# Patient Record
Sex: Male | Born: 1995 | Race: Black or African American | Hispanic: No | Marital: Single | Smoking: Never smoker
Health system: Southern US, Community
[De-identification: ages and names within clinical notes are randomized; demographics above are authoritative.]

---
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
## 2013-07-19 ENCOUNTER — Emergency Department (HOSPITAL_COMMUNITY)
Admission: EM | Admit: 2013-07-19 | Discharge: 2013-07-19 | Disposition: A | Discharge status: Home / Self Care | Payer: Medicaid Other | Attending: Emergency Medicine | Admitting: Emergency Medicine

## 2013-07-19 ENCOUNTER — Encounter (HOSPITAL_COMMUNITY): Payer: Self-pay | Admitting: Emergency Medicine

## 2013-07-19 DIAGNOSIS — R197 Diarrhea, unspecified: Secondary | ICD-10-CM | POA: Insufficient documentation

## 2013-07-19 DIAGNOSIS — R112 Nausea with vomiting, unspecified: Secondary | ICD-10-CM

## 2013-07-19 DIAGNOSIS — Z881 Allergy status to other antibiotic agents status: Secondary | ICD-10-CM | POA: Insufficient documentation

## 2013-07-19 DIAGNOSIS — F1210 Cannabis abuse, uncomplicated: Secondary | ICD-10-CM

## 2013-07-19 LAB — CBC WITH DIFFERENTIAL/PLATELET
Eosinophils Absolute: 0.3 10*3/uL (ref 0.0–0.7)
HCT: 49.7 % (ref 39.0–52.0)
Hemoglobin: 17.4 g/dL — ABNORMAL HIGH (ref 13.0–17.0)
MCH: 29.3 pg (ref 26.0–34.0)
MCV: 83.7 fL (ref 78.0–100.0)
Neutro Abs: 9 10*3/uL — ABNORMAL HIGH (ref 1.7–7.7)
RDW: 14 % (ref 11.5–15.5)

## 2013-07-19 LAB — COMPREHENSIVE METABOLIC PANEL
AST: 21 U/L (ref 0–37)
Alkaline Phosphatase: 61 U/L (ref 39–117)
BUN: 19 mg/dL (ref 6–23)
CO2: 27 mEq/L (ref 19–32)
Chloride: 99 mEq/L (ref 96–112)
Creatinine, Ser: 0.99 mg/dL (ref 0.50–1.35)
GFR calc Af Amer: >90 mL/min (ref >90)
Potassium: 3.9 mEq/L (ref 3.5–5.1)
Total Bilirubin: 2.3 mg/dL — ABNORMAL HIGH (ref 0.3–1.2)
Total Protein: 8.2 g/dL (ref 6.0–8.3)

## 2013-07-19 LAB — URINALYSIS, ROUTINE W REFLEX MICROSCOPIC
Glucose, UA: NEGATIVE mg/dL
Leukocytes, UA: NEGATIVE
Nitrite: NEGATIVE
Protein, ur: NEGATIVE mg/dL

## 2013-07-19 LAB — LIPASE, BLOOD: Lipase: 24 U/L (ref 11–59)

## 2013-07-19 MED ORDER — DIPHENHYDRAMINE HCL 50 MG/ML IJ SOLN
25.0000 mg | Freq: Once | INTRAMUSCULAR | Status: AC
  Administered 2013-07-19: 25 mg via INTRAMUSCULAR
  Filled 2013-07-19: qty 1

## 2013-07-19 MED ORDER — ONDANSETRON 4 MG PO TBDP
4.0000 mg | ORAL_TABLET | Freq: Once | ORAL | Status: AC
  Administered 2013-07-19: 4 mg via ORAL
  Filled 2013-07-19: qty 1

## 2013-07-19 MED ORDER — DICYCLOMINE HCL 20 MG PO TABS: 20.0000 mg | ORAL_TABLET | Freq: Two times a day (BID) | ORAL | Status: DC

## 2013-07-19 MED ORDER — IOHEXOL 300 MG/ML  SOLN: 25.0000 mL | INTRAMUSCULAR | Status: DC

## 2013-07-19 MED ORDER — KETOROLAC TROMETHAMINE 60 MG/2ML IM SOLN
60.0000 mg | Freq: Once | INTRAMUSCULAR | Status: AC
  Administered 2013-07-19: 60 mg via INTRAMUSCULAR
  Filled 2013-07-19: qty 2

## 2013-07-19 MED ORDER — ONDANSETRON HCL 4 MG PO TABS: 4.0000 mg | ORAL_TABLET | Freq: Four times a day (QID) | ORAL | Status: DC

## 2013-07-19 NOTE — ED Provider Notes (Signed)
CSN: [id number]     Arrival date & time 07/19/13  1631 History   First MD Initiated Contact with Patient 07/19/13 1704     Chief Complaint  Patient presents with  . Abdominal Pain  . Emesis   (Consider location/radiation/quality/duration/timing/severity/associated sxs/prior Treatment) HPI Pt is a 17yo male c/o 1-60mo hx of "something weird going on in my stomach" worsening over the last week. C/o generalized abdominal pain, worse in LUQ, 8/10, pain.  Pt unable to describe.  Simply states "it feels like something is moving in there."  Reports 2 episodes of vomiting today, once was pizza, 2nd time was "yellow stuff." Also reports 3 episodes of watery diarrhea w/o blood or mucous. Denies fever or chills. Denies hx of abdominal surgeries. Denies hx of pancreatitis. Does admit to smoking 1 blunt, weed, per day.  Denies change in appetite or weight. Denies urinary symptoms or penile discharge. Has not tried any medications for pain or nausea.  History reviewed. No pertinent past medical history. History reviewed. No pertinent past surgical history. No family history on file. History  Substance Use Topics  . Smoking status: Never Smoker   . Smokeless tobacco: Not on file  . Alcohol Use: Yes    Review of Systems  Constitutional: Negative for fever, chills, diaphoresis and fatigue.  Gastrointestinal: Positive for nausea, vomiting, abdominal pain and diarrhea. Negative for constipation.  All other systems reviewed and are negative.    Allergies  Amoxicillin  Home Medications   Current Outpatient Rx  Name  Route  Sig  Dispense  Refill  . dicyclomine (BENTYL) 20 MG tablet   Oral   Take 1 tablet (20 mg total) by mouth 2 (two) times daily.   20 tablet   0   . ondansetron (ZOFRAN) 4 MG tablet   Oral   Take 1 tablet (4 mg total) by mouth every 6 (six) hours.   12 tablet   0    BP 143/70  Pulse 70  Temp(Src) 98.1 F (36.7 C) (Oral)  Resp 21  SpO2 100% Physical Exam  Nursing  note and vitals reviewed. Constitutional: He appears well-developed and well-nourished.  Pt lying comfortably in exam bed, NAD.   HENT:  Head: Normocephalic and atraumatic.  Eyes: Conjunctivae are normal. No scleral icterus.  Neck: Normal range of motion.  Cardiovascular: Normal rate, regular rhythm and normal heart sounds.   Pulmonary/Chest: Effort normal and breath sounds normal. No respiratory distress. He has no wheezes. He has no rales. He exhibits no tenderness.  Abdominal: Soft. He exhibits no distension and no mass. Bowel sounds are increased. There is tenderness ( diffuse, greatest in epigastrium and LUQ). There is no rebound and no guarding.  Abdomen: increased bowel sounds, soft, non-distended, mild epigastric and LUQ tenderness. No rebound or guarding.   Musculoskeletal: Normal range of motion.  Neurological: He is alert.  Skin: Skin is warm and dry.    ED Course  Procedures (including critical care time) Labs Review Labs Reviewed  CBC WITH DIFFERENTIAL - Abnormal; Notable for the following:    WBC 11.1 (*)    RBC 5.94 (*)    Hemoglobin 17.4 (*)    Neutrophils Relative % 81 (*)    Neutro Abs 9.0 (*)    Lymphocytes Relative 11 (*)    All other components within normal limits  COMPREHENSIVE METABOLIC PANEL - Abnormal; Notable for the following:    Total Bilirubin 2.3 (*)    All other components within normal limits  URINALYSIS, ROUTINE  W REFLEX MICROSCOPIC - Abnormal; Notable for the following:    Ketones, ur 15 (*)    All other components within normal limits  LIPASE, BLOOD   Imaging Review No results found.  EKG Interpretation   None       MDM   1. Nausea vomiting and diarrhea   2. Marijuana abuse    Pt c/o n/v/d and admits to smoking up to 4-5 blunts, weed, per day. Pt is otherwise healthy.  Denies fevers, recent travel, or sick contacts. Denies urinary symptoms or penile discharge. Denies abdominal surgeries.  On exam: Abd-does have increased bowel  sounds, soft, non-distended. Mild tenderness in epigastrium and LUQ.  Not concerned for surgical abdomen.  No tenderness in RUQ or RLQ. Not concerned for cholecystisis, appendicitis, or SOB.  Labs: CBC, CMP, Lipase- unremarkable. Slight WBC-11.1 consistent with acute gastroenteritis. Total bili-only slightly elevated compared to previous labs.   No evidence of acute pancreatitis. Pt denies hx of chronic pancreatitis.  Pt asked to be checked for "everything" stating he has not seen a doctor in over 5-6 years and is sexually active but denies penile discharge or dysuria.  Discussed performing GC/chlamydia swab but advised will not come back for a few days and he will need further STD testing at [facility] as STD checks are not routinely performed in the ER if asymptomatic or no known direct contact with STD.  Pt declined GC/Chlamydia swab at this time stating he will have all his testing done at the health department.  Also discussed low concern for surgical abdomen or emergent process taking place at this time as CT abdomen would likely not should cause of pt's symptoms of "gurgly stomach"  Not concerned for cholecystitis or appendicitis as pt is tender in LUQ and epigastrium, not RUQ or RLQ.  Pt verbalized understanding and stated he agreed to not have CT abdomen performed today. Discussed likely cause of symptoms due to viral gastroenteritis and/or daily marijuana use. Strongly advised to stop smoking marijuana.  Stay well hydrated. Rx: zofran and bentyl. Strict return precautions provided.  Also provided pt info on marijuana use and viral gastroenteritis. Advised to f/u with [facility] to establish PCP as well as discussed need for f/u with Gastroenterology if symptoms not improving in 3-4 days.  Pt verbalized understanding and agreement with tx plan.    Junius Finner, PA-C 07/19/13 1930

## 2013-07-19 NOTE — ED Notes (Signed)
Pt states over the last couple of months he's had "something weird going on in his stomach".  Pt states it has gotten worse and he began vomiting last night.

## 2013-07-19 NOTE — ED Provider Notes (Signed)
Medical screening examination/treatment/procedure(s) were performed by non-physician practitioner and as supervising physician I was immediately available for consultation/collaboration.  EKG Interpretation   None         Krisalyn Yankowski H Reshunda Strider, MD 07/19/13 1951 

## 2014-02-19 ENCOUNTER — Emergency Department (HOSPITAL_COMMUNITY): Payer: Self-pay

## 2014-02-19 ENCOUNTER — Emergency Department (HOSPITAL_COMMUNITY)
Admission: EM | Admit: 2014-02-19 | Discharge: 2014-02-19 | Disposition: A | Discharge status: Home / Self Care | Payer: Self-pay | Attending: Emergency Medicine | Admitting: Emergency Medicine

## 2014-02-19 ENCOUNTER — Encounter (HOSPITAL_COMMUNITY): Payer: Self-pay | Admitting: Emergency Medicine

## 2014-02-19 DIAGNOSIS — W230XXA Caught, crushed, jammed, or pinched between moving objects, initial encounter: Secondary | ICD-10-CM | POA: Insufficient documentation

## 2014-02-19 DIAGNOSIS — Y9289 Other specified places as the place of occurrence of the external cause: Secondary | ICD-10-CM | POA: Insufficient documentation

## 2014-02-19 DIAGNOSIS — S6000XA Contusion of unspecified finger without damage to nail, initial encounter: Secondary | ICD-10-CM | POA: Insufficient documentation

## 2014-02-19 DIAGNOSIS — S60229A Contusion of unspecified hand, initial encounter: Secondary | ICD-10-CM

## 2014-02-19 DIAGNOSIS — Z880 Allergy status to penicillin: Secondary | ICD-10-CM | POA: Insufficient documentation

## 2014-02-19 DIAGNOSIS — Y9389 Activity, other specified: Secondary | ICD-10-CM | POA: Insufficient documentation

## 2014-02-19 NOTE — ED Notes (Signed)
Patient returned from xray.

## 2014-02-19 NOTE — ED Notes (Signed)
Dr. James at bedside  

## 2014-02-19 NOTE — Discharge Instructions (Signed)
Use ice, or motrin for pain. No restrictions to use of hands.  Hand Contusion A hand contusion is a deep bruise on your hand area. Contusions are the result of an injury that caused bleeding under the skin. The contusion may turn blue, purple, or yellow. Minor injuries will give you a painless contusion, but more severe contusions may stay painful and swollen for a few weeks. CAUSES  A contusion is usually caused by a blow, trauma, or direct force to an area of the body. SYMPTOMS   Swelling and redness of the injured area.  Discoloration of the injured area.  Tenderness and soreness of the injured area.  Pain. DIAGNOSIS  The diagnosis can be made by taking a history and performing a physical exam. An X-ray, CT scan, or MRI may be needed to determine if there were any associated injuries, such as broken bones (fractures). TREATMENT  Often, the best treatment for a hand contusion is resting, elevating, icing, and applying cold compresses to the injured area. Over-the-counter medicines may also be recommended for pain control. HOME CARE INSTRUCTIONS   Put ice on the injured area.  Put ice in a plastic bag.  Place a towel between your skin and the bag.  Leave the ice on for 15-20 minutes, 03-04 times a day.  Only take over-the-counter or prescription medicines as directed by your caregiver. Your caregiver may recommend avoiding anti-inflammatory medicines (aspirin, ibuprofen, and naproxen) for 48 hours because these medicines may increase bruising.  If told, use an elastic wrap as directed. This can help reduce swelling. You may remove the wrap for sleeping, showering, and bathing. If your fingers become numb, cold, or blue, take the wrap off and reapply it more loosely.  Elevate your hand with pillows to reduce swelling.  Avoid overusing your hand if it is painful. SEEK IMMEDIATE MEDICAL CARE IF:   You have increased redness, swelling, or pain in your hand.  Your swelling or  pain is not relieved with medicines.  You have loss of feeling in your hand or are unable to move your fingers.  Your hand turns cold or blue.  You have pain when you move your fingers.  Your hand becomes warm to the touch.  Your contusion does not improve in 2 days. MAKE SURE YOU:   Understand these instructions.  Will watch your condition.  Will get help right away if you are not doing well or get worse. Document Released: 02/03/2002 Document Revised: 05/08/2012 Document Reviewed: 02/05/2012 [facility] Patient Information 2015 [place], [place]. This information is not intended to replace advice given to you by your health care provider. Make sure you discuss any questions you have with your health care provider.

## 2014-02-19 NOTE — ED Notes (Signed)
Hood of car closed on both hands last night-- right pinky, ring and middle fingers swollen, left pinky, ring and middle fingers swollen

## 2014-02-19 NOTE — ED Provider Notes (Signed)
CSN: [id number]     Arrival date & time 02/19/14  0716 History   First MD Initiated Contact with Patient 02/19/14 0720     Chief Complaint  Patient presents with  . Finger Injury      HPI  Patient complains of injuries to both hands. Ross noted. He does have [facility] across [facility] no goiter or bruit slid down" in both of his hands. He complains of pain in right hand to reduce the third, fourth, and fifth digit of the left hand second, fourth digit. Previous fracture of his left index finger/second digit.  No lacerations or bleeding. No deformities.  History reviewed. No pertinent past medical history. History reviewed. No pertinent past surgical history. History reviewed. No pertinent family history. History  Substance Use Topics  . Smoking status: Never Smoker   . Smokeless tobacco: Not on file  . Alcohol Use: Yes    Review of Systems  Musculoskeletal:       Bilateral hand pain  Skin:       No lacerations      Allergies  Amoxicillin  Home Medications   Prior to Admission medications   Not on File   BP 118/63  Pulse 51  Temp(Src) 97.9 F (36.6 C) (Oral)  Resp 16  SpO2 100% Physical Exam  Musculoskeletal:       Hands: Range of motion although painful on the above joints. No pain with range of motion of the arms or wrists. No recent skin. Intact sensation to distal digits    ED Course  Procedures (including critical care time) Labs Review Labs Reviewed - No data to display  Imaging Review Dg Hand Complete Left  02/19/2014   CLINICAL DATA:  Fall  EXAM: LEFT HAND - COMPLETE 3+ VIEW  COMPARISON:  None.  FINDINGS: No acute fracture. No dislocation. There is deformity involving the head of the proximal phalanx of the index finger which has a chronic appearance.  IMPRESSION: No acute bony pathology.  Chronic change.   Electronically Signed   By: Maryclare BeanArt  Hoss M.D.   On: 02/19/2014 08:00   Dg Hand Complete Right  02/19/2014   CLINICAL DATA:  Pain and swelling  secondary to blunt trauma.  EXAM: RIGHT HAND - COMPLETE 3+ VIEW  COMPARISON:  None.  FINDINGS: There is no evidence of fracture or dislocation. There is no evidence of arthropathy or other focal bone abnormality. Soft tissues are unremarkable.  IMPRESSION: Normal exam.   Electronically Signed   By: Geanie CooleyJim  Maxwell M.D.   On: 02/19/2014 07:58     EKG Interpretation None      MDM   Final diagnoses:  Hand contusion, unspecified laterality, initial encounter    X-rays show no acute fractures or other abnormalities are noted her to ensure her some Motrin. Use as tolerated without restrictions.    Rolland PorterMark Djeneba Barsch, MD 02/19/14 [phone]

## 2014-10-26 ENCOUNTER — Emergency Department (HOSPITAL_COMMUNITY)
Admission: EM | Admit: 2014-10-26 | Discharge: 2014-10-26 | Disposition: A | Discharge status: Home / Self Care | Payer: Self-pay | Attending: Emergency Medicine | Admitting: Emergency Medicine

## 2014-10-26 ENCOUNTER — Encounter (HOSPITAL_COMMUNITY): Payer: Self-pay | Admitting: Emergency Medicine

## 2014-10-26 DIAGNOSIS — M791 Myalgia, unspecified site: Secondary | ICD-10-CM

## 2014-10-26 DIAGNOSIS — R05 Cough: Secondary | ICD-10-CM | POA: Insufficient documentation

## 2014-10-26 DIAGNOSIS — R51 Headache: Secondary | ICD-10-CM | POA: Insufficient documentation

## 2014-10-26 DIAGNOSIS — Z880 Allergy status to penicillin: Secondary | ICD-10-CM | POA: Insufficient documentation

## 2014-10-26 DIAGNOSIS — R197 Diarrhea, unspecified: Secondary | ICD-10-CM | POA: Insufficient documentation

## 2014-10-26 DIAGNOSIS — R112 Nausea with vomiting, unspecified: Secondary | ICD-10-CM | POA: Insufficient documentation

## 2014-10-26 LAB — COMPREHENSIVE METABOLIC PANEL
ALT: 14 U/L (ref 0–53)
AST: 21 U/L (ref 0–37)
Albumin: 4.4 g/dL (ref 3.5–5.2)
Alkaline Phosphatase: 54 U/L (ref 39–117)
Anion gap: 7 (ref 5–15)
BILIRUBIN TOTAL: 1.7 mg/dL — AB (ref 0.3–1.2)
BUN: 17 mg/dL (ref 6–23)
CO2: 24 mmol/L (ref 19–32)
CREATININE: 1.09 mg/dL (ref 0.50–1.35)
Calcium: 9.6 mg/dL (ref 8.4–10.5)
Chloride: 106 mmol/L (ref 96–112)
GFR calc Af Amer: >90 mL/min (ref >90)
Glucose, Bld: 84 mg/dL (ref 70–99)
Potassium: 3.8 mmol/L (ref 3.5–5.1)
Sodium: 137 mmol/L (ref 135–145)
Total Protein: 8 g/dL (ref 6.0–8.3)

## 2014-10-26 LAB — CBC WITH DIFFERENTIAL/PLATELET
Basophils Absolute: 0 10*3/uL (ref 0.0–0.1)
Basophils Relative: 0 % (ref 0–1)
Eosinophils Absolute: 0.1 10*3/uL (ref 0.0–0.7)
Eosinophils Relative: 1 % (ref 0–5)
HEMATOCRIT: 44.1 % (ref 39.0–52.0)
Hemoglobin: 14.3 g/dL (ref 13.0–17.0)
LYMPHS ABS: 1.1 10*3/uL (ref 0.7–4.0)
Lymphocytes Relative: 18 % (ref 12–46)
MCH: 27.2 pg (ref 26.0–34.0)
MCHC: 32.4 g/dL (ref 30.0–36.0)
MCV: 83.8 fL (ref 78.0–100.0)
Monocytes Absolute: 1.5 10*3/uL — ABNORMAL HIGH (ref 0.1–1.0)
Monocytes Relative: 25 % — ABNORMAL HIGH (ref 3–12)
NEUTROS ABS: 3.3 10*3/uL (ref 1.7–7.7)
NEUTROS PCT: 56 % (ref 43–77)
PLATELETS: 190 10*3/uL (ref 150–400)
RBC: 5.26 MIL/uL (ref 4.22–5.81)
RDW: 14.1 % (ref 11.5–15.5)
WBC: 5.9 10*3/uL (ref 4.0–10.5)

## 2014-10-26 LAB — LIPASE, BLOOD: LIPASE: 21 U/L (ref 11–59)

## 2014-10-26 MED ORDER — PROMETHAZINE HCL 25 MG PO TABS: 25.0000 mg | ORAL_TABLET | Freq: Four times a day (QID) | ORAL | Status: AC | PRN

## 2014-10-26 MED ORDER — DICYCLOMINE HCL 20 MG PO TABS: 20.0000 mg | ORAL_TABLET | Freq: Three times a day (TID) | ORAL | Status: AC

## 2014-10-26 MED ORDER — IBUPROFEN 800 MG PO TABS: 800.0000 mg | ORAL_TABLET | Freq: Three times a day (TID) | ORAL | Status: AC | PRN

## 2014-10-26 MED ORDER — DICYCLOMINE HCL 10 MG PO CAPS
20.0000 mg | ORAL_CAPSULE | Freq: Once | ORAL | Status: AC
  Administered 2014-10-26: 20 mg via ORAL
  Filled 2014-10-26: qty 2

## 2014-10-26 MED ORDER — SODIUM CHLORIDE 0.9 % IV BOLUS (SEPSIS)
1000.0000 mL | Freq: Once | INTRAVENOUS | Status: AC
  Administered 2014-10-26: 1000 mL via INTRAVENOUS

## 2014-10-26 MED ORDER — ONDANSETRON HCL 4 MG/2ML IJ SOLN
4.0000 mg | Freq: Once | INTRAMUSCULAR | Status: AC
  Administered 2014-10-26: 4 mg via INTRAVENOUS
  Filled 2014-10-26: qty 2

## 2014-10-26 MED ORDER — LOPERAMIDE HCL 2 MG PO CAPS: 2.0000 mg | ORAL_CAPSULE | Freq: Four times a day (QID) | ORAL | Status: AC | PRN

## 2014-10-26 MED ORDER — KETOROLAC TROMETHAMINE 30 MG/ML IJ SOLN
30.0000 mg | Freq: Once | INTRAMUSCULAR | Status: AC
  Administered 2014-10-26: 30 mg via INTRAVENOUS
  Filled 2014-10-26: qty 1

## 2014-10-26 NOTE — ED Provider Notes (Addendum)
TIME SEEN: 8:45 AM  CHIEF COMPLAINT: Headache, body aches, nausea, vomiting, diarrhea  HPI: Pt is a 19 y.o. F with no significant past medical history who presents to the emergency department with subjective fever, chills, headache, body aches, nausea, vomiting and diarrhea that started last night. No bloody stool or melena. No dysuria or hematuria. Has had a very mild intermittent dry cough. Has tried Tylenol Cold and mucus without much relief. No sick contacts or recent travel. Did not have a influenza vaccination this year.  ROS: See HPI Constitutional: subjective fever  Eyes: no drainage  ENT: no runny nose   Cardiovascular:  no chest pain  Resp: no SOB  GI:  vomiting GU: no dysuria Integumentary: no rash  Allergy: no hives  Musculoskeletal: no leg swelling  Neurological: no slurred speech ROS otherwise negative  PAST MEDICAL HISTORY/PAST SURGICAL HISTORY:  No past medical history on file.  MEDICATIONS:  Prior to Admission medications   Not on File    ALLERGIES:  Allergies  Allergen Reactions  . Amoxicillin Other (See Comments)    Childhood allergy= maybe passed out    SOCIAL HISTORY:  History  Substance Use Topics  . Smoking status: Never Smoker   . Smokeless tobacco: Not on file  . Alcohol Use: Yes    FAMILY HISTORY: No family history on file.  EXAM: BP 121/75 mmHg  Pulse 63  Temp(Src) 98.9 F (37.2 C) (Oral)  Resp 16  SpO2 98% CONSTITUTIONAL: Alert and oriented and responds appropriately to questions. Well-appearing; well-nourished, nontoxic appearing HEAD: Normocephalic EYES: Conjunctivae clear, PERRL ENT: normal nose; no rhinorrhea; moist mucous membranes; posterior oropharynx is erythematous without tonsillar hypertrophy or exudate, no uvular deviation, no trismus or drooling NECK: Supple, no meningismus, no LAD  CARD: RRR; S1 and S2 appreciated; no murmurs, no clicks, no rubs, no gallops RESP: Normal chest excursion without splinting or tachypnea;  breath sounds clear and equal bilaterally; no wheezes, no rhonchi, no rales, no hypoxia ABD/GI: Normal bowel sounds; non-distended; soft, mildly tender to palpation diffusely without guarding or rebound, no peritoneal signs BACK:  The back appears normal and is non-tender to palpation, there is no CVA tenderness EXT: Normal ROM in all joints; non-tender to palpation; no edema; normal capillary refill; no cyanosis    SKIN: Normal color for age and race; warm; no rash NEURO: Moves all extremities equally PSYCH: The patient's mood and manner are appropriate. Grooming and personal hygiene are appropriate.  MEDICAL DECISION MAKING: Pt here with nausea, vomiting and diarrhea, body aches and headache. Hemodynamically stable.  Suspect viral illness. Will treat with IV fluids, Zofran, Toradol. We'll obtain labs, urine. Doubt pneumonia, meningitis. He is well-appearing, nontoxic.  ED PROGRESS: Patient reports feeling much better. He is now sitting upright in the bed, smiling, laughing with friends. Labs unremarkable. Able to tolerate by mouth.  Suspect viral gastroenteritis. We'll discharge him with prescriptions for Phenergan, ibuprofen, Imodium, Bentyl. Discussed return precautions. He verbalizes understanding and is comfortable with plan.     Layla MawKristen N Chanelle Hodsdon, DO 10/26/14 1054  Khylei Wilms N Zamir Staples, DO 10/26/14 1104

## 2014-10-26 NOTE — ED Notes (Signed)
Bed: [id number] Expected date:  Expected time:  Means of arrival:  Comments: EMS- 18yo, flu-like symptoms

## 2014-10-26 NOTE — ED Notes (Signed)
Pt alert,oriented, and ambulatory upon DC. 

## 2014-10-26 NOTE — ED Notes (Signed)
Pt from home via [facility] c/o flu like symptoms x few days. Pt reports nausea, vomiting, body aches, headache, and light sensitivity.

## 2014-10-26 NOTE — Discharge Instructions (Signed)

## 2014-10-26 NOTE — ED Notes (Signed)
Pt tolerating PO fluids well. He reports he feels better. Pain 5/10 and he states "I can open my eyes now"

## 2014-10-26 NOTE — ED Notes (Signed)
Pt. Was given ginger ale and tolerated it well.

## 2014-11-15 ENCOUNTER — Emergency Department (HOSPITAL_COMMUNITY)
Admission: EM | Admit: 2014-11-15 | Discharge: 2014-11-15 | Disposition: A | Discharge status: Home / Self Care | Payer: Self-pay | Attending: Emergency Medicine | Admitting: Emergency Medicine

## 2014-11-15 ENCOUNTER — Encounter (HOSPITAL_COMMUNITY): Payer: Self-pay | Admitting: *Deleted

## 2014-11-15 DIAGNOSIS — R300 Dysuria: Secondary | ICD-10-CM | POA: Insufficient documentation

## 2014-11-15 DIAGNOSIS — R369 Urethral discharge, unspecified: Secondary | ICD-10-CM | POA: Insufficient documentation

## 2014-11-15 DIAGNOSIS — Z880 Allergy status to penicillin: Secondary | ICD-10-CM | POA: Insufficient documentation

## 2014-11-15 MED ORDER — CEFTRIAXONE SODIUM 250 MG IJ SOLR
250.0000 mg | Freq: Once | INTRAMUSCULAR | Status: AC
  Administered 2014-11-15: 250 mg via INTRAMUSCULAR
  Filled 2014-11-15: qty 250

## 2014-11-15 MED ORDER — AZITHROMYCIN 250 MG PO TABS
1000.0000 mg | ORAL_TABLET | Freq: Once | ORAL | Status: AC
  Administered 2014-11-15: 1000 mg via ORAL
  Filled 2014-11-15: qty 4

## 2014-11-15 MED ORDER — LIDOCAINE HCL (PF) 1 % IJ SOLN
INTRAMUSCULAR | Status: AC
  Administered 2014-11-15: 23:00:00
  Filled 2014-11-15: qty 5

## 2014-11-15 NOTE — ED Provider Notes (Signed)
CSN: [id number]     Arrival date & time 11/15/14  2034 History  This chart was scribed for non-physician practitioner, Celene Skeen, PA-C,working with Eber Hong, MD, by Karle Plumber, ED Scribe. This patient was seen in room TR09C/TR09C and the patient's care was started at 9:26 PM.  Chief Complaint  Patient presents with  . SEXUALLY TRANSMITTED DISEASE   The history is provided by the patient and medical records. No language interpreter was used.    HPI Comments:  Ryan Hutchinson is a 19 y.o. male who presents to the Emergency Department complaining of dysuria that began earlier today. He states he had unprotected sex with a new partner yesterday. He reports associated white discharge when the penis is squeezed. He denies fever, chills, nausea, vomiting, penile or testicular pain.   History reviewed. No pertinent past medical history. History reviewed. No pertinent past surgical history. History reviewed. No pertinent family history. History  Substance Use Topics  . Smoking status: Never Smoker   . Smokeless tobacco: Not on file  . Alcohol Use: Yes    Review of Systems  Constitutional: Negative for fever and chills.  Gastrointestinal: Negative for nausea and vomiting.  Genitourinary: Positive for dysuria and discharge. Negative for penile pain and testicular pain.  All other systems reviewed and are negative.   Allergies  Amoxicillin  Home Medications   Prior to Admission medications   Medication Sig Start Date End Date Taking? Authorizing Provider  dicyclomine (BENTYL) 20 MG tablet Take 1 tablet (20 mg total) by mouth 3 (three) times daily before meals. As needed for abdominal cramping 10/26/14   Kristen N Ward, DO  ibuprofen (ADVIL,MOTRIN) 800 MG tablet Take 1 tablet (800 mg total) by mouth every 8 (eight) hours as needed for mild pain. 10/26/14   Kristen N Ward, DO  loperamide (IMODIUM) 2 MG capsule Take 1 capsule (2 mg total) by mouth 4 (four) times daily as needed for  diarrhea or loose stools. 10/26/14   Kristen N Ward, DO  OVER THE COUNTER MEDICATION Take 30 mLs by mouth every 4 (four) hours as needed (body aches and headaches.). Tylenol Cold and Mucus Severe    Historical Provider, MD  promethazine (PHENERGAN) 25 MG tablet Take 1 tablet (25 mg total) by mouth every 6 (six) hours as needed for nausea or vomiting. 10/26/14   Layla Maw Ward, DO   Triage Vitals: BP 119/68 mmHg  Pulse 57  Temp(Src) 97.8 F (36.6 C) (Oral)  Resp 22  SpO2 99% Physical Exam  Constitutional: He is oriented to person, place, and time. He appears well-developed and well-nourished. No distress.  HENT:  Head: Normocephalic and atraumatic.  Eyes: Conjunctivae and EOM are normal.  Neck: Normal range of motion. Neck supple.  Cardiovascular: Normal rate, regular rhythm and normal heart sounds.   Pulmonary/Chest: Effort normal and breath sounds normal.  Genitourinary: Testes normal. Discharge (white) found.  Exam chaperoned by scribe.  Musculoskeletal: Normal range of motion. He exhibits no edema.  Neurological: He is alert and oriented to person, place, and time.  Skin: Skin is warm and dry.  Psychiatric: He has a normal mood and affect. His behavior is normal.  Nursing note and vitals reviewed.   ED Course  Procedures (including critical care time) DIAGNOSTIC STUDIES: Oxygen Saturation is 99% on RA, normal by my interpretation.   COORDINATION OF CARE: 9:30 PM- Will check and treat for GC/chlamydia. Pt verbalizes understanding and agrees to plan.  Medications  cefTRIAXone (ROCEPHIN) injection 250 mg (  not administered)  azithromycin (ZITHROMAX) tablet 1,000 mg (not administered)    Labs Review Labs Reviewed  GC/CHLAMYDIA PROBE AMP ([place])    Imaging Review No results found.   EKG Interpretation None      MDM   Final diagnoses:  Penile discharge   Patient with penile discharge after sexual partner. Nontoxic appearing, NAD. GC/Chlamydia cultures  pending. Treated with Rocephin and azithromycin. Safe sexual practices discussed. Stable for d/c. Return precautions given. Patient states understanding of treatment care plan and is agreeable.  I personally performed the services described in this documentation, which was scribed in my presence. The recorded information has been reviewed and is accurate.    Kathrynn SpeedRobyn M Clinton Wahlberg, PA-C 11/15/14 2154  Eber HongBrian Miller, MD 11/16/14 [phone]

## 2014-11-15 NOTE — ED Notes (Signed)
Pt in stating he thinks he has an STD from a girl he had sex with yesterday, no distress noted, will not give specifics on symptoms

## 2014-11-15 NOTE — Discharge Instructions (Signed)
You were treated today for both gonorrhea and chlamydia. If these tests result positive, you will be contacted and are then obligated to inform your partner for treatment. °Sexually Transmitted Disease °A sexually transmitted disease (STD) is a disease or infection that may be passed (transmitted) from person to person, usually during sexual activity. This may happen by way of saliva, semen, blood, vaginal mucus, or urine. Common STDs include:  °· Gonorrhea.   °· Chlamydia.   °· Syphilis.   °· HIV and AIDS.   °· Genital herpes.   °· Hepatitis B and C.   °· Trichomonas.   °· Human papillomavirus (HPV).   °· Pubic lice.   °· Scabies. °· Mites. °· Bacterial vaginosis. °WHAT ARE CAUSES OF STDs? °An STD may be caused by bacteria, a virus, or parasites. STDs are often transmitted during sexual activity if one person is infected. However, they may also be transmitted through nonsexual means. STDs may be transmitted after:  °· Sexual intercourse with an infected person.   °· Sharing sex toys with an infected person.   °· Sharing needles with an infected person or using unclean piercing or tattoo needles. °· Having intimate contact with the genitals, mouth, or rectal areas of an infected person.   °· Exposure to infected fluids during birth. °WHAT ARE THE SIGNS AND SYMPTOMS OF STDs? °Different STDs have different symptoms. Some people may not have any symptoms. If symptoms are present, they may include:  °· Painful or bloody urination.   °· Pain in the pelvis, abdomen, vagina, anus, throat, or eyes.   °· A skin rash, itching, or irritation. °· Growths, ulcerations, blisters, or sores in the genital and anal areas. °· Abnormal vaginal discharge with or without bad odor.   °· Penile discharge in men.   °· Fever.   °· Pain or bleeding during sexual intercourse.   °· Swollen glands in the groin area.   °· Yellow skin and eyes (jaundice). This is seen with hepatitis.   °· Swollen testicles. °· Infertility. °· Sores and blisters  in the mouth. °HOW ARE STDs DIAGNOSED? °To make a diagnosis, your health care provider may:  °· Take a medical history.   °· Perform a physical exam.   °· Take a sample of any discharge to examine. °· Swab the throat, cervix, opening to the penis, rectum, or vagina for testing. °· Test a sample of your first morning urine.   °· Perform blood tests.   °· Perform a Pap test, if this applies.   °· Perform a colposcopy.   °· Perform a laparoscopy.   °HOW ARE STDs TREATED? ° Treatment depends on the STD. Some STDs may be treated but not cured.  °· Chlamydia, gonorrhea, trichomonas, and syphilis can be cured with antibiotic medicine.   °· Genital herpes, hepatitis, and HIV can be treated, but not cured, with prescribed medicines. The medicines lessen symptoms.   °· Genital warts from HPV can be treated with medicine or by freezing, burning (electrocautery), or surgery. Warts may come back.   °· HPV cannot be cured with medicine or surgery. However, abnormal areas may be removed from the cervix, vagina, or vulva.   °· If your diagnosis is confirmed, your recent sexual partners need treatment. This is true even if they are symptom-free or have a negative culture or evaluation. They should not have sex until their health care providers say it is okay. °HOW CAN I REDUCE MY RISK OF GETTING AN STD? °Take these steps to reduce your risk of getting an STD: °· Use latex condoms, dental dams, and water-soluble lubricants during sexual activity. Do not use petroleum jelly or oils. °· Avoid having multiple sex   partners.  Do not have sex with someone who has other sex partners.  Do not have sex with anyone you do not know or who is at high risk for an STD.  Avoid risky sex practices that can break your skin.  Do not have sex if you have open sores on your mouth or skin.  Avoid drinking too much alcohol or taking illegal drugs. Alcohol and drugs can affect your judgment and put you in a vulnerable position.  Avoid engaging  in oral and anal sex acts.  Get vaccinated for HPV and hepatitis. If you have not received these vaccines in the past, talk to your health care provider about whether one or both might be right for you.   If you are at risk of being infected with HIV, it is recommended that you take a prescription medicine daily to prevent HIV infection. This is called pre-exposure prophylaxis (PrEP). You are considered at risk if:  You are a man who has sex with other men (MSM).  You are a heterosexual man or woman and are sexually active with more than one partner.  You take drugs by injection.  You are sexually active with a partner who has HIV.  Talk with your health care provider about whether you are at high risk of being infected with HIV. If you choose to begin PrEP, you should first be tested for HIV. You should then be tested every 3 months for as long as you are taking PrEP.  WHAT SHOULD I DO IF I THINK I HAVE AN STD?  See your health care provider.   Tell your sexual partner(s). They should be tested and treated for any STDs.  Do not have sex until your health care provider says it is okay. WHEN SHOULD I GET IMMEDIATE MEDICAL CARE? Contact your health care provider right away if:   You have severe abdominal pain.  You are a man and notice swelling or pain in your testicles.  You are a woman and notice swelling or pain in your vagina. Document Released: 11/04/2002 Document Revised: 08/19/2013 Document Reviewed: 03/04/2013 [facility] Patient Information 2015 [employer], [place]. This information is not intended to replace advice given to you by your health care provider. Make sure you discuss any questions you have with your health care provider.

## 2014-11-16 LAB — GC/CHLAMYDIA PROBE AMP (~~LOC~~) NOT AT ARMC
Chlamydia: NEGATIVE
Neisseria Gonorrhea: POSITIVE — AB

## 2014-11-18 ENCOUNTER — Telehealth (HOSPITAL_COMMUNITY): Payer: Self-pay

## 2014-11-18 NOTE — ED Notes (Signed)
Positive for gonorrhea. Treated per protocol. Attempting to contact pt.  

## 2014-11-19 ENCOUNTER — Telehealth (HOSPITAL_BASED_OUTPATIENT_CLINIC_OR_DEPARTMENT_OTHER): Payer: Self-pay | Admitting: *Deleted

## 2014-11-20 ENCOUNTER — Telehealth (HOSPITAL_BASED_OUTPATIENT_CLINIC_OR_DEPARTMENT_OTHER): Payer: Self-pay | Admitting: Emergency Medicine

## 2014-11-21 NOTE — Telephone Encounter (Signed)
Unable to contact patient via phone. Sent letter. °

## 2014-12-15 ENCOUNTER — Telehealth (HOSPITAL_COMMUNITY): Payer: Self-pay

## 2014-12-15 NOTE — ED Notes (Signed)
Unable to contact pt by mail or telephone. Unable to communicate lab results or treatment changes. 

## 2015-09-10 IMAGING — CR DG HAND COMPLETE 3+V*R*
3 series · 3 of 3 positions shown · non-contrast
Comparison: None.

CLINICAL DATA: Pain and swelling secondary to blunt trauma.

EXAM:
RIGHT HAND - COMPLETE 3+ VIEW

[x hand pa right]
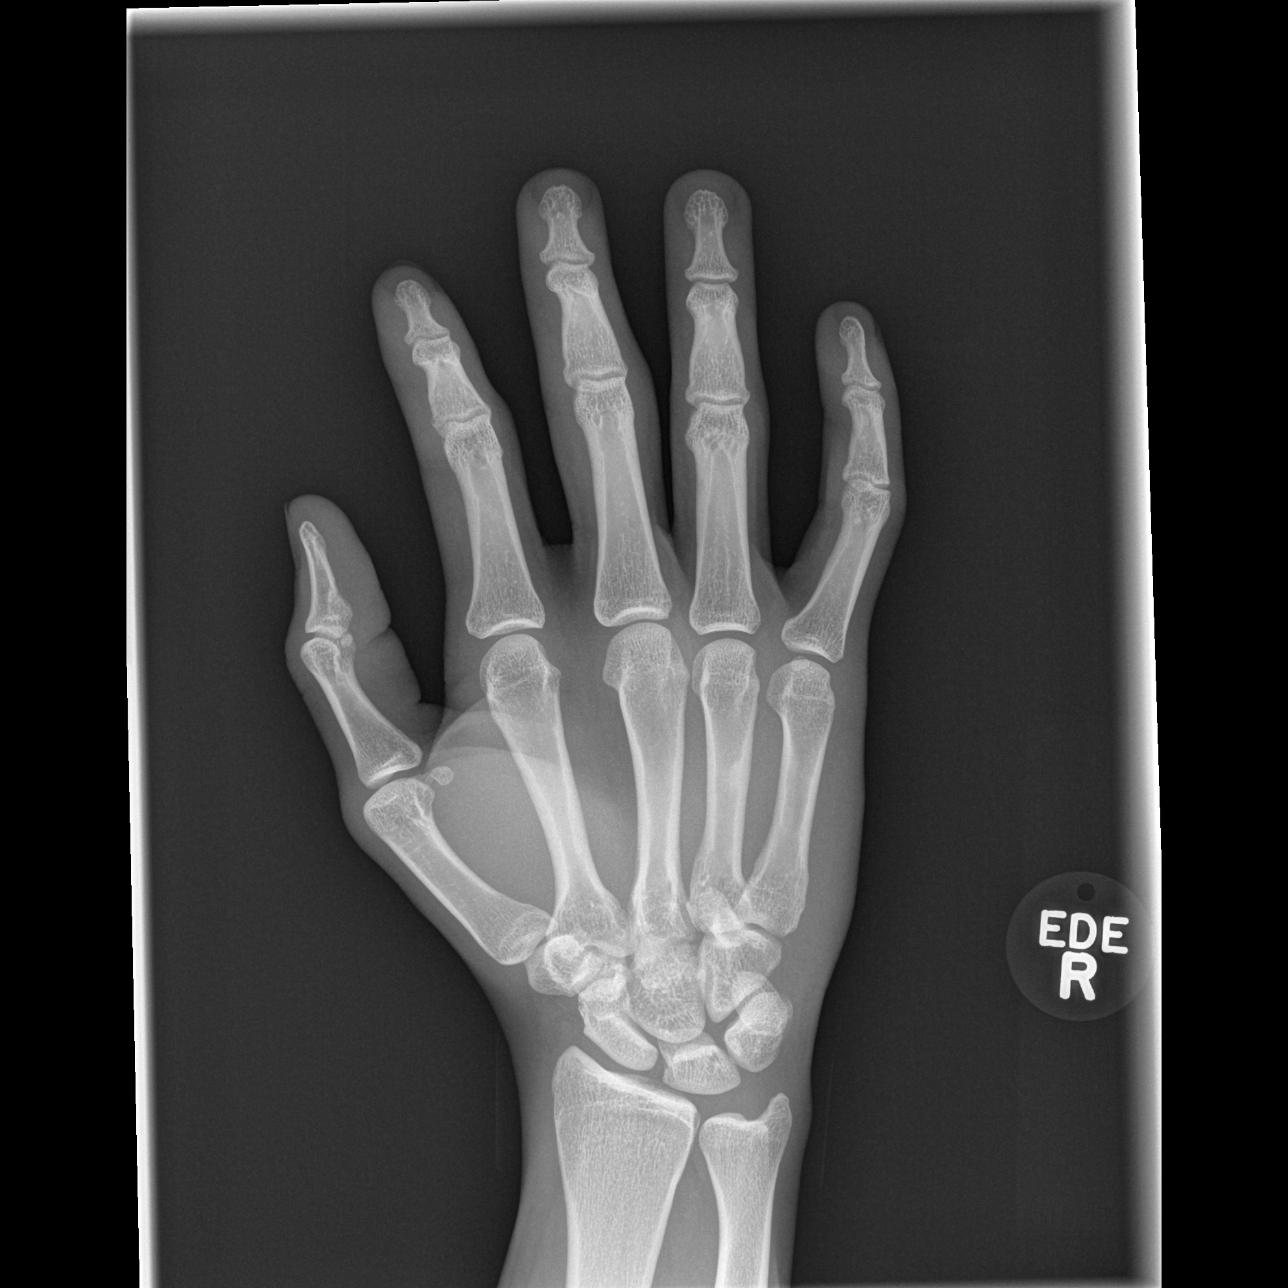

[x hand oblique right]
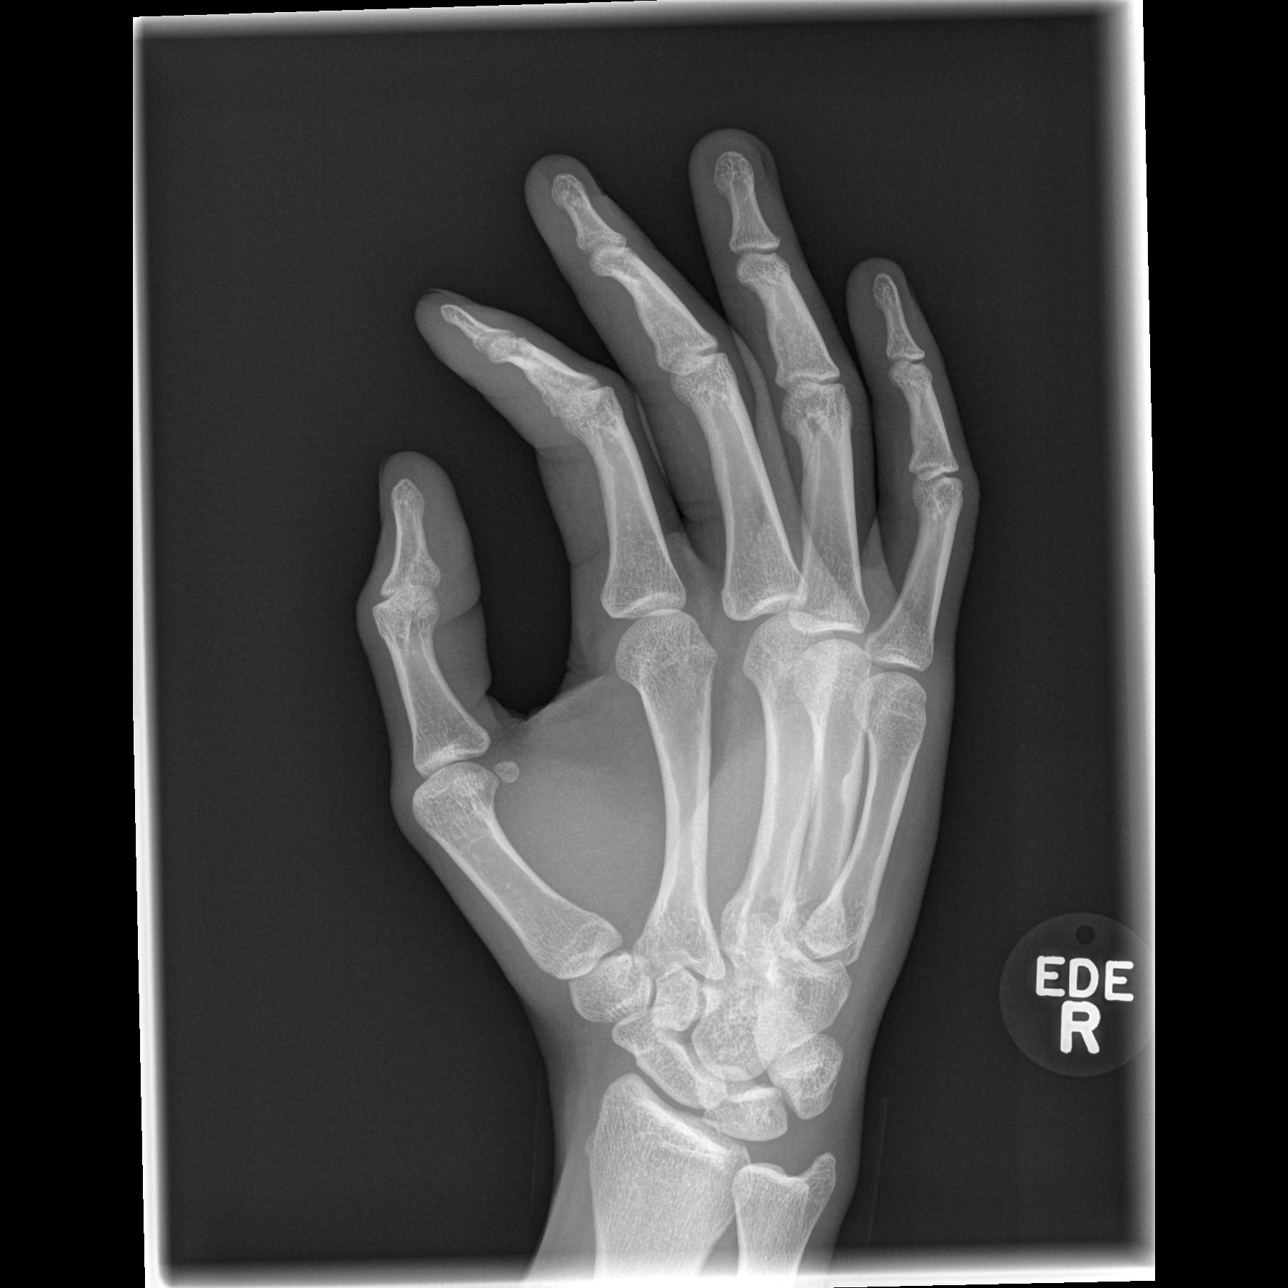

[x hand lat right]
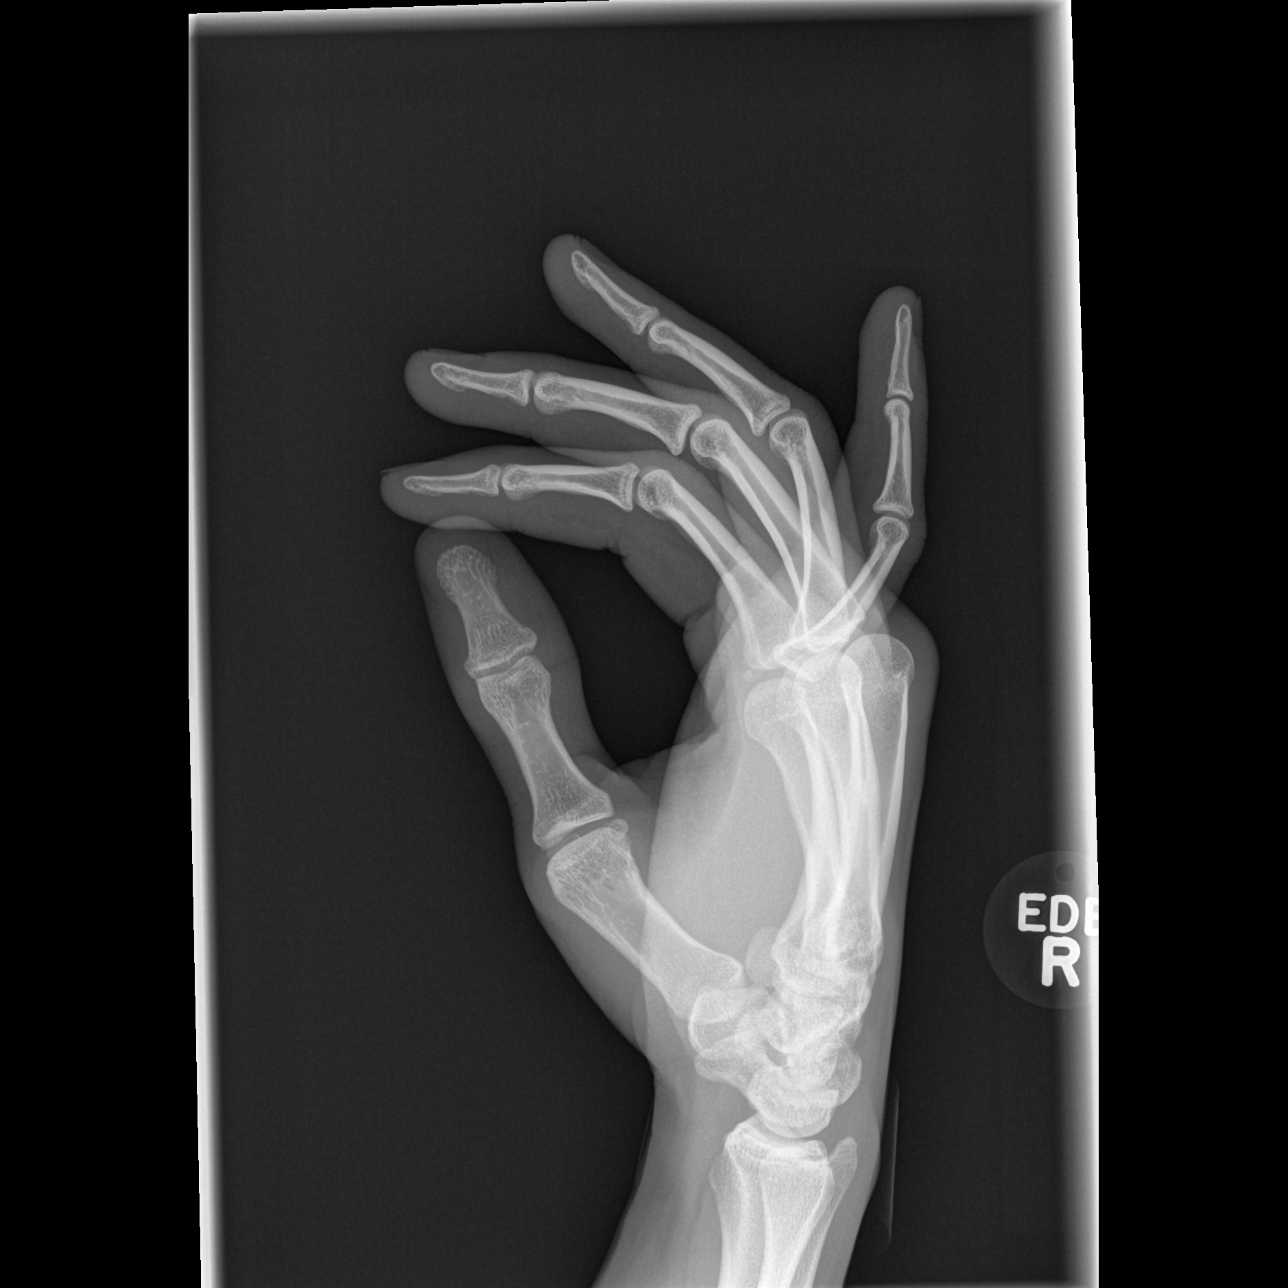

[3 of 3 positions shown; findings below may reference images not displayed]

FINDINGS: There is no evidence of fracture or dislocation. There is no
evidence of arthropathy or other focal bone abnormality. Soft
tissues are unremarkable.
IMPRESSION: Normal exam.

## 2015-09-25 ENCOUNTER — Emergency Department (HOSPITAL_COMMUNITY)
Admission: EM | Admit: 2015-09-25 | Discharge: 2015-09-25 | Disposition: A | Discharge status: Home / Self Care | Payer: Self-pay | Attending: Emergency Medicine | Admitting: Emergency Medicine

## 2015-09-25 ENCOUNTER — Encounter (HOSPITAL_COMMUNITY): Payer: Self-pay | Admitting: Emergency Medicine

## 2015-09-25 DIAGNOSIS — Z880 Allergy status to penicillin: Secondary | ICD-10-CM | POA: Insufficient documentation

## 2015-09-25 DIAGNOSIS — Z113 Encounter for screening for infections with a predominantly sexual mode of transmission: Secondary | ICD-10-CM | POA: Insufficient documentation

## 2015-09-25 DIAGNOSIS — R369 Urethral discharge, unspecified: Secondary | ICD-10-CM | POA: Insufficient documentation

## 2015-09-25 DIAGNOSIS — Z711 Person with feared health complaint in whom no diagnosis is made: Secondary | ICD-10-CM

## 2015-09-25 LAB — URINALYSIS, ROUTINE W REFLEX MICROSCOPIC
Bilirubin Urine: NEGATIVE
Glucose, UA: NEGATIVE mg/dL
Hgb urine dipstick: NEGATIVE
Ketones, ur: NEGATIVE mg/dL
LEUKOCYTES UA: NEGATIVE
NITRITE: NEGATIVE
PROTEIN: NEGATIVE mg/dL
SPECIFIC GRAVITY, URINE: 1.027 (ref 1.005–1.030)
pH: 6.5 (ref 5.0–8.0)

## 2015-09-25 MED ORDER — CEFTRIAXONE SODIUM 250 MG IJ SOLR
250.0000 mg | Freq: Once | INTRAMUSCULAR | Status: AC
  Administered 2015-09-25: 250 mg via INTRAMUSCULAR
  Filled 2015-09-25: qty 250

## 2015-09-25 MED ORDER — IBUPROFEN 400 MG PO TABS
800.0000 mg | ORAL_TABLET | Freq: Once | ORAL | Status: AC
  Administered 2015-09-25: 800 mg via ORAL
  Filled 2015-09-25: qty 2

## 2015-09-25 MED ORDER — AZITHROMYCIN 250 MG PO TABS
1000.0000 mg | ORAL_TABLET | Freq: Every day | ORAL | Status: DC
  Administered 2015-09-25: 1000 mg via ORAL
  Filled 2015-09-25: qty 4

## 2015-09-25 NOTE — Discharge Instructions (Signed)
Sexually Transmitted Disease °A sexually transmitted disease (STD) is a disease or infection that may be passed (transmitted) from person to person, usually during sexual activity. This may happen by way of saliva, semen, blood, vaginal mucus, or urine. Common STDs include: °· Gonorrhea. °· Chlamydia. °· Syphilis. °· HIV and AIDS. °· Genital herpes. °· Hepatitis B and C. °· Trichomonas. °· Human papillomavirus (HPV). °· Pubic lice. °· Scabies. °· Mites. °· Bacterial vaginosis. °WHAT ARE CAUSES OF STDs? °An STD may be caused by bacteria, a virus, or parasites. STDs are often transmitted during sexual activity if one person is infected. However, they may also be transmitted through nonsexual means. STDs may be transmitted after:  °· Sexual intercourse with an infected person. °· Sharing sex toys with an infected person. °· Sharing needles with an infected person or using unclean piercing or tattoo needles. °· Having intimate contact with the genitals, mouth, or rectal areas of an infected person. °· Exposure to infected fluids during birth. °WHAT ARE THE SIGNS AND SYMPTOMS OF STDs? °Different STDs have different symptoms. Some people may not have any symptoms. If symptoms are present, they may include: °· Painful or bloody urination. °· Pain in the pelvis, abdomen, vagina, anus, throat, or eyes. °· A skin rash, itching, or irritation. °· Growths, ulcerations, blisters, or sores in the genital and anal areas. °· Abnormal vaginal discharge with or without bad odor. °· Penile discharge in men. °· Fever. °· Pain or bleeding during sexual intercourse. °· Swollen glands in the groin area. °· Yellow skin and eyes (jaundice). This is seen with hepatitis. °· Swollen testicles. °· Infertility. °· Sores and blisters in the mouth. °HOW ARE STDs DIAGNOSED? °To make a diagnosis, your health care provider may: °· Take a medical history. °· Perform a physical exam. °· Take a sample of any discharge to examine. °· Swab the throat,  cervix, opening to the penis, rectum, or vagina for testing. °· Test a sample of your first morning urine. °· Perform blood tests. °· Perform a Pap test, if this applies. °· Perform a colposcopy. °· Perform a laparoscopy. °HOW ARE STDs TREATED? °Treatment depends on the STD. Some STDs may be treated but not cured. °· Chlamydia, gonorrhea, trichomonas, and syphilis can be cured with antibiotic medicine. °· Genital herpes, hepatitis, and HIV can be treated, but not cured, with prescribed medicines. The medicines lessen symptoms. °· Genital warts from HPV can be treated with medicine or by freezing, burning (electrocautery), or surgery. Warts may come back. °· HPV cannot be cured with medicine or surgery. However, abnormal areas may be removed from the cervix, vagina, or vulva. °· If your diagnosis is confirmed, your recent sexual partners need treatment. This is true even if they are symptom-free or have a negative culture or evaluation. They should not have sex until their health care providers say it is okay. °· Your health care provider may test you for infection again 3 months after treatment. °HOW CAN I REDUCE MY RISK OF GETTING AN STD? °Take these steps to reduce your risk of getting an STD: °· Use latex condoms, dental dams, and water-soluble lubricants during sexual activity. Do not use petroleum jelly or oils. °· Avoid having multiple sex partners. °· Do not have sex with someone who has other sex partners °· Do not have sex with anyone you do not know or who is at high risk for an STD. °· Avoid risky sex practices that can break your skin. °· Do not have sex   if you have open sores on your mouth or skin. °· Avoid drinking too much alcohol or taking illegal drugs. Alcohol and drugs can affect your judgment and put you in a vulnerable position. °· Avoid engaging in oral and anal sex acts. °· Get vaccinated for HPV and hepatitis. If you have not received these vaccines in the past, talk to your health care  provider about whether one or both might be right for you. °· If you are at risk of being infected with HIV, it is recommended that you take a prescription medicine daily to prevent HIV infection. This is called pre-exposure prophylaxis (PrEP). You are considered at risk if: °¨ You are a man who has sex with other men (MSM). °¨ You are a heterosexual man or woman and are sexually active with more than one partner. °¨ You take drugs by injection. °¨ You are sexually active with a partner who has HIV. °· Talk with your health care provider about whether you are at high risk of being infected with HIV. If you choose to begin PrEP, you should first be tested for HIV. You should then be tested every 3 months for as long as you are taking PrEP. °WHAT SHOULD I DO IF I THINK I HAVE AN STD? °· See your health care provider. °· Tell your sexual partner(s). They should be tested and treated for any STDs. °· Do not have sex until your health care provider says it is okay. °WHEN SHOULD I GET IMMEDIATE MEDICAL CARE? °Contact your health care provider right away if:  °· You have severe abdominal pain. °· You are a man and notice swelling or pain in your testicles. °· You are a woman and notice swelling or pain in your vagina. °  °This information is not intended to replace advice given to you by your health care provider. Make sure you discuss any questions you have with your health care provider. °  °Document Released: 11/04/2002 Document Revised: 09/04/2014 Document Reviewed: 03/04/2013 °Elsevier Interactive Patient Education ©2016 [employer] ° °

## 2015-09-25 NOTE — ED Provider Notes (Signed)
CSN: [id number]     Arrival date & time 09/25/15  1040 History  By signing my name below, I, Ryan Hutchinson, attest that this documentation has been prepared under the direction and in the presence of Cheri Fowler, PA-C Electronically Signed: Phillis Hutchinson, ED Scribe. 09/25/2015. 11:16 AM.  Chief Complaint  Patient presents with  . SEXUALLY TRANSMITTED DISEASE   The history is provided by the patient. No language interpreter was used.  HPI Comments: Ryan Hutchinson is a 20 y.o. male who presents to the Emergency Department complaining of dysuria onset 2 days ago. Pt reports associated white penile discharge. He states that the symptoms began after he had unprotected sex 4 days ago. Pt denies worsening or alleviating factors. He denies penile redness, hematuria, fever, chills, or abdominal pain.   History reviewed. No pertinent past medical history. History reviewed. No pertinent past surgical history. No family history on file. Social History  Substance Use Topics  . Smoking status: Never Smoker   . Smokeless tobacco: None  . Alcohol Use: Yes    Review of Systems  Constitutional: Negative for fever and chills.  Gastrointestinal: Negative for abdominal pain.  Genitourinary: Positive for dysuria and discharge. Negative for hematuria and penile pain.  All other systems reviewed and are negative.  Allergies  Amoxicillin  Home Medications   Prior to Admission medications   Medication Sig Start Date End Date Taking? Authorizing Provider  dicyclomine (BENTYL) 20 MG tablet Take 1 tablet (20 mg total) by mouth 3 (three) times daily before meals. As needed for abdominal cramping 10/26/14   Kristen N Ward, DO  ibuprofen (ADVIL,MOTRIN) 800 MG tablet Take 1 tablet (800 mg total) by mouth every 8 (eight) hours as needed for mild pain. 10/26/14   Kristen N Ward, DO  loperamide (IMODIUM) 2 MG capsule Take 1 capsule (2 mg total) by mouth 4 (four) times daily as needed for diarrhea or loose stools.  10/26/14   Kristen N Ward, DO  OVER THE COUNTER MEDICATION Take 30 mLs by mouth every 4 (four) hours as needed (body aches and headaches.). Tylenol Cold and Mucus Severe    Historical Provider, MD  promethazine (PHENERGAN) 25 MG tablet Take 1 tablet (25 mg total) by mouth every 6 (six) hours as needed for nausea or vomiting. 10/26/14   Kristen N Ward, DO   BP 139/89 mmHg  Pulse 65  Temp(Src) 97.8 F (36.6 C) (Oral)  Resp 18  Ht 6' (1.829 m)  Wt 70.308 kg  BMI 21.02 kg/m2  SpO2 100% Physical Exam  Constitutional: He is oriented to person, place, and time. He appears well-developed and well-nourished.  Non-toxic appearance. He does not have a sickly appearance. He does not appear ill.  HENT:  Head: Normocephalic and atraumatic.  Mouth/Throat: Oropharynx is clear and moist.  Eyes: Conjunctivae are normal. Pupils are equal, round, and reactive to light.  Neck: Normal range of motion. Neck supple.  Cardiovascular: Normal rate, regular rhythm and normal heart sounds.   No murmur heard. Pulmonary/Chest: Effort normal and breath sounds normal. No accessory muscle usage or stridor. No respiratory distress. He has no wheezes. He has no rhonchi. He has no rales.  Abdominal: Soft. Bowel sounds are normal. He exhibits no distension. There is no tenderness.  Genitourinary: Penis normal. Right testis shows no mass, no swelling and no tenderness. Left testis shows no mass, no swelling and no tenderness. No penile erythema or penile tenderness. No discharge found.  Musculoskeletal: Normal range of motion.  Lymphadenopathy:    He has no cervical adenopathy.  Neurological: He is alert and oriented to person, place, and time.  Speech clear without dysarthria.  Skin: Skin is warm and dry.  Psychiatric: He has a normal mood and affect. His behavior is normal.    ED Course  Procedures (including critical care time) DIAGNOSTIC STUDIES: Oxygen Saturation is 100% on RA, normal by my interpretation.     COORDINATION OF CARE: 11:15 AM-Discussed treatment plan which includes UA and anti-biotics with pt at bedside and pt agreed to plan.    Labs Review Labs Reviewed  URINE CULTURE  URINALYSIS, ROUTINE W REFLEX MICROSCOPIC (NOT AT [facility])  RPR  HIV ANTIBODY (ROUTINE TESTING)  GC/CHLAMYDIA PROBE AMP ([place]) NOT AT [facility]    Imaging Review No results found. I have personally reviewed and evaluated these images and lab results as part of my medical decision-making.   EKG Interpretation None      MDM  Patient treated in the ED for STI with Rocephin and Azithromycin. Patient advised to inform and treat all sexual partners if positive.  Pt advised on safe sex practices and understands that they have GC/Chlamydia cultures pending and will result in 2-3 days. HIV and RPR sent. Pt encouraged to follow up at local health department for future STI checks. No concern for prostatitis or epididymitis. Discussed return precautions. Pt appears safe for discharge.   Final diagnoses:  Concern about STD in male without diagnosis    I personally performed the services described in this documentation, which was scribed in my presence. The recorded information has been reviewed and is accurate.    Cheri Fowler, PA-C 09/25/15 1256  Arby Barrette, MD 10/03/15 [phone]

## 2015-09-25 NOTE — ED Notes (Signed)
Pt c/o had unprotected sex recently and noticed a white discharge from penis and burning with urination.

## 2015-09-26 LAB — URINE CULTURE: Culture: NO GROWTH

## 2015-09-26 LAB — HIV ANTIBODY (ROUTINE TESTING W REFLEX): HIV Screen 4th Generation wRfx: NONREACTIVE

## 2015-09-26 LAB — RPR: RPR Ser Ql: NONREACTIVE

## 2015-09-27 LAB — GC/CHLAMYDIA PROBE AMP (~~LOC~~) NOT AT ARMC
Chlamydia: POSITIVE — AB
NEISSERIA GONORRHEA: NEGATIVE

## 2015-09-28 ENCOUNTER — Telehealth (HOSPITAL_BASED_OUTPATIENT_CLINIC_OR_DEPARTMENT_OTHER): Payer: Self-pay | Admitting: Emergency Medicine

## 2015-10-14 ENCOUNTER — Emergency Department (HOSPITAL_COMMUNITY)
Admission: EM | Admit: 2015-10-14 | Discharge: 2015-10-14 | Disposition: A | Discharge status: Home / Self Care | Payer: Self-pay | Attending: Emergency Medicine | Admitting: Emergency Medicine

## 2015-10-14 ENCOUNTER — Encounter (HOSPITAL_COMMUNITY): Payer: Self-pay | Admitting: Cardiology

## 2015-10-14 DIAGNOSIS — Z880 Allergy status to penicillin: Secondary | ICD-10-CM | POA: Insufficient documentation

## 2015-10-14 DIAGNOSIS — R369 Urethral discharge, unspecified: Secondary | ICD-10-CM | POA: Insufficient documentation

## 2015-10-14 LAB — URINALYSIS, ROUTINE W REFLEX MICROSCOPIC
Bilirubin Urine: NEGATIVE
GLUCOSE, UA: NEGATIVE mg/dL
Hgb urine dipstick: NEGATIVE
Ketones, ur: NEGATIVE mg/dL
LEUKOCYTES UA: NEGATIVE
Nitrite: NEGATIVE
Protein, ur: NEGATIVE mg/dL
Specific Gravity, Urine: 1.025 (ref 1.005–1.030)
pH: 6 (ref 5.0–8.0)

## 2015-10-14 MED ORDER — AZITHROMYCIN 250 MG PO TABS
1000.0000 mg | ORAL_TABLET | Freq: Once | ORAL | Status: AC
  Administered 2015-10-14: 1000 mg via ORAL
  Filled 2015-10-14: qty 4

## 2015-10-14 MED ORDER — LIDOCAINE HCL (PF) 1 % IJ SOLN
INTRAMUSCULAR | Status: AC
  Administered 2015-10-14: 11:00:00
  Filled 2015-10-14: qty 5

## 2015-10-14 MED ORDER — CEFTRIAXONE SODIUM 250 MG IJ SOLR
250.0000 mg | Freq: Once | INTRAMUSCULAR | Status: AC
  Administered 2015-10-14: 250 mg via INTRAMUSCULAR
  Filled 2015-10-14: qty 250

## 2015-10-14 NOTE — ED Notes (Addendum)
Pt states woke up this am with white d/c from penis denies dysuria some odor states his girlfriend was tx for chamydia  X 3 weeks ago  He was suppose to be tx also but did not like shots pt is requesting male dr

## 2015-10-14 NOTE — ED Notes (Signed)
States he wants to be checked for STD bc he has been having penile discharge for a couple of days.

## 2015-10-14 NOTE — ED Provider Notes (Signed)
CSN: [id number]     Arrival date & time 10/14/15  0865 History   First MD Initiated Contact with Patient 10/14/15 [phone]     Chief Complaint  Patient presents with  . Exposure to STD     (Consider location/radiation/quality/duration/timing/severity/associated sxs/prior Treatment) HPI   Pt presents with abnormal white discharge from his penis that he noticed this morning.  Recent testing for STDs, tested positive for chlamydia, received only Rocephin in ED.  Pt told staff his girlfriend tested positive for chlamydia recently as well.  Denies fevers, chills, abdominal pain, N/V, penile pain, penile or genital lesions, testicular swelling or pain.   History reviewed. No pertinent past medical history. History reviewed. No pertinent past surgical history. History reviewed. No pertinent family history. Social History  Substance Use Topics  . Smoking status: Never Smoker   . Smokeless tobacco: None  . Alcohol Use: Yes    Review of Systems  Constitutional: Negative for fever and chills.  Gastrointestinal: Negative for nausea, vomiting and abdominal pain.  Genitourinary: Negative for dysuria, urgency and frequency.  Skin: Negative for rash.  Allergic/Immunologic: Negative for immunocompromised state.  Hematological: Does not bruise/bleed easily.  Psychiatric/Behavioral: Negative for self-injury.      Allergies  Amoxicillin  Home Medications   Prior to Admission medications   Medication Sig Start Date End Date Taking? Authorizing Provider  dicyclomine (BENTYL) 20 MG tablet Take 1 tablet (20 mg total) by mouth 3 (three) times daily before meals. As needed for abdominal cramping 10/26/14   Kristen N Ward, DO  ibuprofen (ADVIL,MOTRIN) 800 MG tablet Take 1 tablet (800 mg total) by mouth every 8 (eight) hours as needed for mild pain. 10/26/14   Kristen N Ward, DO  loperamide (IMODIUM) 2 MG capsule Take 1 capsule (2 mg total) by mouth 4 (four) times daily as needed for diarrhea or loose  stools. 10/26/14   Kristen N Ward, DO  OVER THE COUNTER MEDICATION Take 30 mLs by mouth every 4 (four) hours as needed (body aches and headaches.). Tylenol Cold and Mucus Severe    Historical Provider, MD  promethazine (PHENERGAN) 25 MG tablet Take 1 tablet (25 mg total) by mouth every 6 (six) hours as needed for nausea or vomiting. 10/26/14   Kristen N Ward, DO   BP 144/69 mmHg  Pulse 54  Temp(Src) 97.4 F (36.3 C) (Oral)  Resp 16  Wt 70.308 kg  SpO2 100% Physical Exam  Constitutional: He appears well-developed and well-nourished. No distress.  HENT:  Head: Normocephalic and atraumatic.  Neck: Neck supple.  Pulmonary/Chest: Effort normal.  Abdominal: Soft.  Genitourinary: Testes normal and penis normal. Right testis shows no mass, no swelling and no tenderness. Right testis is descended. Left testis shows no mass, no swelling and no tenderness. Left testis is descended. Circumcised. No penile erythema or penile tenderness. No discharge found.  Neurological: He is alert.  Skin: He is not diaphoretic.  Nursing note and vitals reviewed.   ED Course  Procedures (including critical care time) Labs Review Labs Reviewed  URINALYSIS, ROUTINE W REFLEX MICROSCOPIC (NOT AT [facility])  RPR  HIV ANTIBODY (ROUTINE TESTING)  GC/CHLAMYDIA PROBE AMP ([place]) NOT AT [facility]    Imaging Review No results found. I have personally reviewed and evaluated these images and lab results as part of my medical decision-making.   EKG Interpretation None      MDM   Final diagnoses:  Abnormal penile discharge    Afebrile nontoxic patient presents with abnormal white discharge  from his penis this morning.  No other symptoms. Tested positive for chlamydia on 09/25/15 but appears to only have been treated with rocephin.  Full testing performed.  UA does not show trichomonas.  HIV/STD testing pending.  Treated with rocephin, azithromycin.  D/C home.  Discussed result, findings, treatment, and follow up  with  patient.  Pt given return precautions.  Pt verbalizes understanding and agrees with plan.           Trixie Dredge, PA-C 10/14/15 1046  Laurence Spates, MD 10/14/15 [phone]

## 2015-10-14 NOTE — Discharge Instructions (Signed)
Read the information below.  You may return to the Emergency Department at any time for worsening condition or any new symptoms that concern you. °

## 2015-10-15 LAB — GC/CHLAMYDIA PROBE AMP (~~LOC~~) NOT AT ARMC
Chlamydia: NEGATIVE
Neisseria Gonorrhea: NEGATIVE

## 2015-10-15 LAB — HIV ANTIBODY (ROUTINE TESTING W REFLEX): HIV Screen 4th Generation wRfx: NONREACTIVE

## 2015-10-15 LAB — RPR: RPR Ser Ql: NONREACTIVE

## 2015-10-16 ENCOUNTER — Telehealth (HOSPITAL_COMMUNITY): Payer: Self-pay

## 2016-02-12 ENCOUNTER — Emergency Department (HOSPITAL_COMMUNITY)
Admission: EM | Admit: 2016-02-12 | Discharge: 2016-02-26 | Disposition: E | Payer: Self-pay | Attending: Emergency Medicine | Admitting: Emergency Medicine

## 2016-02-12 DIAGNOSIS — I468 Cardiac arrest due to other underlying condition: Secondary | ICD-10-CM | POA: Insufficient documentation

## 2016-02-12 DIAGNOSIS — I469 Cardiac arrest, cause unspecified: Secondary | ICD-10-CM

## 2016-02-12 DIAGNOSIS — S0993XA Unspecified injury of face, initial encounter: Secondary | ICD-10-CM | POA: Insufficient documentation

## 2016-02-12 DIAGNOSIS — Y999 Unspecified external cause status: Secondary | ICD-10-CM | POA: Insufficient documentation

## 2016-02-12 DIAGNOSIS — Y92410 Unspecified street and highway as the place of occurrence of the external cause: Secondary | ICD-10-CM | POA: Insufficient documentation

## 2016-02-12 DIAGNOSIS — Y939 Activity, unspecified: Secondary | ICD-10-CM | POA: Insufficient documentation

## 2016-02-12 LAB — PREPARE FRESH FROZEN PLASMA
UNIT DIVISION: 0
Unit division: 0
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
## 2016-02-12 NOTE — ED Notes (Signed)
Time of death called by dr.[email or web address] 2124.
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
## 2016-02-26 DEATH — deceased
